# Patient Record
Sex: Male | Born: 1995
Health system: Southern US, Community
[De-identification: ages and names within clinical notes are randomized; demographics above are authoritative.]

---
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
## 2018-04-04 ENCOUNTER — Inpatient Hospital Stay (HOSPITAL_COMMUNITY): Payer: BLUE CROSS/BLUE SHIELD

## 2018-04-04 ENCOUNTER — Encounter (HOSPITAL_COMMUNITY): Admission: EM | Disposition: A | Discharge status: Home Health | Payer: Self-pay | Source: Home / Self Care

## 2018-04-04 ENCOUNTER — Other Ambulatory Visit: Payer: Self-pay

## 2018-04-04 ENCOUNTER — Encounter (HOSPITAL_COMMUNITY): Payer: Self-pay | Admitting: Emergency Medicine

## 2018-04-04 ENCOUNTER — Inpatient Hospital Stay (HOSPITAL_COMMUNITY)
Admission: EM | Admit: 2018-04-04 | Discharge: 2018-04-07 | DRG: 956 | Disposition: A | Discharge status: Home Health | Payer: BLUE CROSS/BLUE SHIELD | Attending: General Surgery | Admitting: General Surgery

## 2018-04-04 ENCOUNTER — Inpatient Hospital Stay (HOSPITAL_COMMUNITY): Payer: BLUE CROSS/BLUE SHIELD | Admitting: Anesthesiology

## 2018-04-04 ENCOUNTER — Emergency Department (HOSPITAL_COMMUNITY): Payer: BLUE CROSS/BLUE SHIELD

## 2018-04-04 DIAGNOSIS — S27329A Contusion of lung, unspecified, initial encounter: Secondary | ICD-10-CM

## 2018-04-04 DIAGNOSIS — Z87891 Personal history of nicotine dependence: Secondary | ICD-10-CM

## 2018-04-04 DIAGNOSIS — S72321A Displaced transverse fracture of shaft of right femur, initial encounter for closed fracture: Principal | ICD-10-CM

## 2018-04-04 DIAGNOSIS — R40241 Glasgow coma scale score 13-15, unspecified time: Secondary | ICD-10-CM | POA: Diagnosis present

## 2018-04-04 DIAGNOSIS — M79604 Pain in right leg: Secondary | ICD-10-CM | POA: Diagnosis present

## 2018-04-04 DIAGNOSIS — S27321A Contusion of lung, unilateral, initial encounter: Secondary | ICD-10-CM | POA: Diagnosis present

## 2018-04-04 DIAGNOSIS — S0001XA Abrasion of scalp, initial encounter: Secondary | ICD-10-CM | POA: Diagnosis present

## 2018-04-04 DIAGNOSIS — Z79899 Other long term (current) drug therapy: Secondary | ICD-10-CM | POA: Diagnosis not present

## 2018-04-04 DIAGNOSIS — Z9889 Other specified postprocedural states: Secondary | ICD-10-CM

## 2018-04-04 DIAGNOSIS — Y92410 Unspecified street and highway as the place of occurrence of the external cause: Secondary | ICD-10-CM

## 2018-04-04 DIAGNOSIS — Z419 Encounter for procedure for purposes other than remedying health state, unspecified: Secondary | ICD-10-CM

## 2018-04-04 DIAGNOSIS — R0781 Pleurodynia: Secondary | ICD-10-CM | POA: Diagnosis present

## 2018-04-04 DIAGNOSIS — S7291XA Unspecified fracture of right femur, initial encounter for closed fracture: Secondary | ICD-10-CM | POA: Diagnosis present

## 2018-04-04 DIAGNOSIS — S060X9A Concussion with loss of consciousness of unspecified duration, initial encounter: Secondary | ICD-10-CM | POA: Diagnosis present

## 2018-04-04 HISTORY — PX: FEMUR IM NAIL: SHX1597

## 2018-04-04 LAB — COMPREHENSIVE METABOLIC PANEL
ALT: 32 U/L (ref 0–44)
AST: 37 U/L (ref 15–41)
Albumin: 4.1 g/dL (ref 3.5–5.0)
Alkaline Phosphatase: 83 U/L (ref 38–126)
Anion gap: 13 (ref 5–15)
BUN: 14 mg/dL (ref 6–20)
CO2: 22 mmol/L (ref 22–32)
Calcium: 9.2 mg/dL (ref 8.9–10.3)
Chloride: 102 mmol/L (ref 98–111)
Creatinine, Ser: 0.89 mg/dL (ref 0.61–1.24)
GFR calc Af Amer: >60 mL/min (ref >60)
Glucose, Bld: 158 mg/dL — ABNORMAL HIGH (ref 70–99)
Potassium: 3.2 mmol/L — ABNORMAL LOW (ref 3.5–5.1)
Sodium: 137 mmol/L (ref 135–145)
TOTAL PROTEIN: 6.8 g/dL (ref 6.5–8.1)
Total Bilirubin: 0.7 mg/dL (ref 0.3–1.2)

## 2018-04-04 LAB — URINALYSIS, ROUTINE W REFLEX MICROSCOPIC
Bacteria, UA: NONE SEEN
Bilirubin Urine: NEGATIVE
Glucose, UA: 50 mg/dL — AB
Ketones, ur: NEGATIVE mg/dL
Leukocytes,Ua: NEGATIVE
Nitrite: NEGATIVE
Protein, ur: NEGATIVE mg/dL
Specific Gravity, Urine: 1.03 (ref 1.005–1.030)
pH: 6 (ref 5.0–8.0)

## 2018-04-04 LAB — MRSA PCR SCREENING: MRSA by PCR: NEGATIVE

## 2018-04-04 LAB — CDS SEROLOGY

## 2018-04-04 LAB — SAMPLE TO BLOOD BANK

## 2018-04-04 LAB — PROTIME-INR
INR: 0.9 (ref 0.8–1.2)
Prothrombin Time: 12.3 seconds (ref 11.4–15.2)

## 2018-04-04 LAB — CBC
HCT: 43 % (ref 39.0–52.0)
Hemoglobin: 14.4 g/dL (ref 13.0–17.0)
MCH: 30.3 pg (ref 26.0–34.0)
MCHC: 33.5 g/dL (ref 30.0–36.0)
MCV: 90.3 fL (ref 80.0–100.0)
Platelets: 398 10*3/uL (ref 150–400)
RBC: 4.76 MIL/uL (ref 4.22–5.81)
RDW: 11.8 % (ref 11.5–15.5)
WBC: 23.3 10*3/uL — ABNORMAL HIGH (ref 4.0–10.5)
nRBC: 0 % (ref 0.0–0.2)

## 2018-04-04 LAB — HIV ANTIBODY (ROUTINE TESTING W REFLEX): HIV Screen 4th Generation wRfx: NONREACTIVE

## 2018-04-04 LAB — ETHANOL: Alcohol, Ethyl (B): <10 mg/dL (ref <10)

## 2018-04-04 SURGERY — INSERTION, INTRAMEDULLARY ROD, FEMUR, RETROGRADE
Anesthesia: General | Site: Leg Upper | Laterality: Right

## 2018-04-04 MED ORDER — FENTANYL CITRATE (PF) 100 MCG/2ML IJ SOLN
INTRAMUSCULAR | Status: DC | PRN
  Administered 2018-04-04: 50 ug via INTRAVENOUS
  Administered 2018-04-04: 150 ug via INTRAVENOUS
  Administered 2018-04-04 (×2): 100 ug via INTRAVENOUS
  Administered 2018-04-04: 50 ug via INTRAVENOUS

## 2018-04-04 MED ORDER — OXYCODONE HCL 5 MG PO TABS
5.0000 mg | ORAL_TABLET | ORAL | Status: DC | PRN
  Administered 2018-04-04 – 2018-04-06 (×6): 5 mg via ORAL
  Filled 2018-04-04 (×6): qty 1

## 2018-04-04 MED ORDER — PROPOFOL 10 MG/ML IV BOLUS
INTRAVENOUS | Status: DC | PRN
  Administered 2018-04-04: 160 mg via INTRAVENOUS

## 2018-04-04 MED ORDER — ACETAMINOPHEN 500 MG PO TABS: 1000.0000 mg | ORAL_TABLET | Freq: Once | ORAL | Status: DC | PRN

## 2018-04-04 MED ORDER — ACETAMINOPHEN 160 MG/5ML PO SOLN: 1000.0000 mg | Freq: Once | ORAL | Status: DC | PRN

## 2018-04-04 MED ORDER — ENOXAPARIN SODIUM 40 MG/0.4ML ~~LOC~~ SOLN
40.0000 mg | SUBCUTANEOUS | Status: DC
  Administered 2018-04-05 – 2018-04-07 (×3): 40 mg via SUBCUTANEOUS
  Filled 2018-04-04 (×3): qty 0.4

## 2018-04-04 MED ORDER — METOPROLOL TARTRATE 5 MG/5ML IV SOLN: 5.0000 mg | Freq: Four times a day (QID) | INTRAVENOUS | Status: DC | PRN

## 2018-04-04 MED ORDER — LACTATED RINGERS IV SOLN
INTRAVENOUS | Status: DC | PRN
  Administered 2018-04-04 (×2): via INTRAVENOUS

## 2018-04-04 MED ORDER — ONDANSETRON HCL 4 MG/2ML IJ SOLN
INTRAMUSCULAR | Status: DC | PRN
  Administered 2018-04-04: 4 mg via INTRAVENOUS

## 2018-04-04 MED ORDER — ACETAMINOPHEN 325 MG PO TABS: 650.0000 mg | ORAL_TABLET | ORAL | Status: DC | PRN

## 2018-04-04 MED ORDER — HYDRALAZINE HCL 20 MG/ML IJ SOLN: 10.0000 mg | INTRAMUSCULAR | Status: DC | PRN

## 2018-04-04 MED ORDER — MIDAZOLAM HCL 2 MG/2ML IJ SOLN
INTRAMUSCULAR | Status: AC
  Filled 2018-04-04: qty 2

## 2018-04-04 MED ORDER — ONDANSETRON HCL 4 MG/2ML IJ SOLN
INTRAMUSCULAR | Status: AC
  Filled 2018-04-04: qty 2

## 2018-04-04 MED ORDER — SUGAMMADEX SODIUM 200 MG/2ML IV SOLN
INTRAVENOUS | Status: DC | PRN
  Administered 2018-04-04: 300 mg via INTRAVENOUS

## 2018-04-04 MED ORDER — DEXMEDETOMIDINE HCL IN NACL 200 MCG/50ML IV SOLN
INTRAVENOUS | Status: AC
  Filled 2018-04-04: qty 50

## 2018-04-04 MED ORDER — BUPIVACAINE HCL (PF) 0.25 % IJ SOLN
INTRAMUSCULAR | Status: AC
  Filled 2018-04-04: qty 30

## 2018-04-04 MED ORDER — OXYCODONE HCL 5 MG/5ML PO SOLN: 5.0000 mg | Freq: Once | ORAL | Status: DC | PRN

## 2018-04-04 MED ORDER — FENTANYL CITRATE (PF) 250 MCG/5ML IJ SOLN
INTRAMUSCULAR | Status: AC
  Filled 2018-04-04: qty 5

## 2018-04-04 MED ORDER — SUCCINYLCHOLINE CHLORIDE 20 MG/ML IJ SOLN
INTRAMUSCULAR | Status: DC | PRN
  Administered 2018-04-04: 100 mg via INTRAVENOUS

## 2018-04-04 MED ORDER — HYDROMORPHONE HCL 1 MG/ML IJ SOLN
1.0000 mg | Freq: Once | INTRAMUSCULAR | Status: AC
  Administered 2018-04-04: 1 mg via INTRAVENOUS
  Filled 2018-04-04: qty 1

## 2018-04-04 MED ORDER — SODIUM CHLORIDE 0.9 % IV BOLUS
1000.0000 mL | Freq: Once | INTRAVENOUS | Status: AC
  Administered 2018-04-04: 1000 mL via INTRAVENOUS

## 2018-04-04 MED ORDER — ROCURONIUM BROMIDE 100 MG/10ML IV SOLN
INTRAVENOUS | Status: DC | PRN
  Administered 2018-04-04: 30 mg via INTRAVENOUS

## 2018-04-04 MED ORDER — DEXMEDETOMIDINE HCL 200 MCG/2ML IV SOLN
INTRAVENOUS | Status: DC | PRN
  Administered 2018-04-04 (×3): 20 ug via INTRAVENOUS

## 2018-04-04 MED ORDER — IOHEXOL 300 MG/ML  SOLN
125.0000 mL | Freq: Once | INTRAMUSCULAR | Status: AC | PRN
  Administered 2018-04-04: 125 mL via INTRAVENOUS

## 2018-04-04 MED ORDER — 0.9 % SODIUM CHLORIDE (POUR BTL) OPTIME
TOPICAL | Status: DC | PRN
  Administered 2018-04-04 (×2): 1000 mL

## 2018-04-04 MED ORDER — PROPOFOL 10 MG/ML IV BOLUS
INTRAVENOUS | Status: AC
  Filled 2018-04-04: qty 20

## 2018-04-04 MED ORDER — CEFAZOLIN SODIUM-DEXTROSE 2-3 GM-%(50ML) IV SOLR
INTRAVENOUS | Status: DC | PRN
  Administered 2018-04-04: 2 g via INTRAVENOUS

## 2018-04-04 MED ORDER — DOCUSATE SODIUM 100 MG PO CAPS
100.0000 mg | ORAL_CAPSULE | Freq: Two times a day (BID) | ORAL | Status: DC
  Administered 2018-04-04 – 2018-04-07 (×6): 100 mg via ORAL
  Filled 2018-04-04 (×6): qty 1

## 2018-04-04 MED ORDER — METHOCARBAMOL 1000 MG/10ML IJ SOLN
500.0000 mg | Freq: Four times a day (QID) | INTRAVENOUS | Status: DC | PRN
  Administered 2018-04-04 – 2018-04-05 (×2): 500 mg via INTRAVENOUS
  Filled 2018-04-04 (×3): qty 5

## 2018-04-04 MED ORDER — OXYCODONE HCL 5 MG PO TABS: 5.0000 mg | ORAL_TABLET | Freq: Once | ORAL | Status: DC | PRN

## 2018-04-04 MED ORDER — CEFAZOLIN SODIUM-DEXTROSE 2-4 GM/100ML-% IV SOLN
2.0000 g | Freq: Three times a day (TID) | INTRAVENOUS | Status: AC
  Administered 2018-04-04 – 2018-04-05 (×3): 2 g via INTRAVENOUS
  Filled 2018-04-04 (×3): qty 100

## 2018-04-04 MED ORDER — SODIUM CHLORIDE 0.9 % IV SOLN
INTRAVENOUS | Status: DC
  Administered 2018-04-04 – 2018-04-05 (×2): via INTRAVENOUS

## 2018-04-04 MED ORDER — SUCCINYLCHOLINE CHLORIDE 200 MG/10ML IV SOSY
PREFILLED_SYRINGE | INTRAVENOUS | Status: AC
  Filled 2018-04-04: qty 10

## 2018-04-04 MED ORDER — MIDAZOLAM HCL 5 MG/5ML IJ SOLN
INTRAMUSCULAR | Status: DC | PRN
  Administered 2018-04-04: 2 mg via INTRAVENOUS

## 2018-04-04 MED ORDER — HYDROMORPHONE HCL 1 MG/ML IJ SOLN
0.5000 mg | INTRAMUSCULAR | Status: DC | PRN
  Administered 2018-04-04 – 2018-04-07 (×9): 0.5 mg via INTRAVENOUS
  Filled 2018-04-04 (×10): qty 1

## 2018-04-04 MED ORDER — ACETAMINOPHEN 10 MG/ML IV SOLN: 1000.0000 mg | Freq: Once | INTRAVENOUS | Status: DC | PRN

## 2018-04-04 MED ORDER — ROCURONIUM BROMIDE 50 MG/5ML IV SOSY
PREFILLED_SYRINGE | INTRAVENOUS | Status: AC
  Filled 2018-04-04: qty 5

## 2018-04-04 MED ORDER — FENTANYL CITRATE (PF) 100 MCG/2ML IJ SOLN
INTRAMUSCULAR | Status: AC
  Filled 2018-04-04: qty 2

## 2018-04-04 MED ORDER — SODIUM CHLORIDE 0.9 % IV SOLN
INTRAVENOUS | Status: DC
  Administered 2018-04-04: 06:00:00 via INTRAVENOUS

## 2018-04-04 MED ORDER — FENTANYL CITRATE (PF) 100 MCG/2ML IJ SOLN
25.0000 ug | INTRAMUSCULAR | Status: DC | PRN
  Administered 2018-04-04 (×2): 50 ug via INTRAVENOUS

## 2018-04-04 MED ORDER — LIDOCAINE HCL (CARDIAC) PF 100 MG/5ML IV SOSY
PREFILLED_SYRINGE | INTRAVENOUS | Status: DC | PRN
  Administered 2018-04-04: 80 mg via INTRAVENOUS

## 2018-04-04 MED ORDER — GABAPENTIN 300 MG PO CAPS
300.0000 mg | ORAL_CAPSULE | Freq: Three times a day (TID) | ORAL | Status: DC
  Administered 2018-04-04 – 2018-04-07 (×10): 300 mg via ORAL
  Filled 2018-04-04 (×10): qty 1

## 2018-04-04 MED ORDER — ONDANSETRON 4 MG PO TBDP: 4.0000 mg | ORAL_TABLET | Freq: Four times a day (QID) | ORAL | Status: DC | PRN

## 2018-04-04 MED ORDER — FENTANYL CITRATE (PF) 100 MCG/2ML IJ SOLN
INTRAMUSCULAR | Status: AC
  Administered 2018-04-04: 100 ug
  Filled 2018-04-04: qty 2

## 2018-04-04 MED ORDER — ONDANSETRON HCL 4 MG/2ML IJ SOLN: 4.0000 mg | Freq: Four times a day (QID) | INTRAMUSCULAR | Status: DC | PRN

## 2018-04-04 MED ORDER — LIDOCAINE 2% (20 MG/ML) 5 ML SYRINGE
INTRAMUSCULAR | Status: AC
  Filled 2018-04-04: qty 5

## 2018-04-04 SURGICAL SUPPLY — 68 items
ALCOHOL 70% 16 OZ (MISCELLANEOUS) ×3 IMPLANT
BANDAGE ACE 6X5 VEL STRL LF (GAUZE/BANDAGES/DRESSINGS) ×3 IMPLANT
BIT DRILL CALIBRATED 4.3MMX365 (DRILL) ×1 IMPLANT
BIT DRILL CROWE PNT TWST 4.5MM (DRILL) ×2 IMPLANT
BLADE CLIPPER SURG (BLADE) IMPLANT
BLADE SURG 15 STRL LF DISP TIS (BLADE) ×1 IMPLANT
BLADE SURG 15 STRL SS (BLADE) ×2
BNDG ELASTIC 6X15 VLCR STRL LF (GAUZE/BANDAGES/DRESSINGS) ×3 IMPLANT
BNDG GAUZE ELAST 4 BULKY (GAUZE/BANDAGES/DRESSINGS) ×3 IMPLANT
COVER SURGICAL LIGHT HANDLE (MISCELLANEOUS) ×3 IMPLANT
COVER WAND RF STERILE (DRAPES) ×3 IMPLANT
CUFF TOURNIQUET SINGLE 34IN LL (TOURNIQUET CUFF) IMPLANT
CUFF TOURNIQUET SINGLE 44IN (TOURNIQUET CUFF) IMPLANT
DRAPE C-ARM 42X72 X-RAY (DRAPES) ×3 IMPLANT
DRAPE HALF SHEET 40X57 (DRAPES) ×6 IMPLANT
DRAPE IMP U-DRAPE 54X76 (DRAPES) ×6 IMPLANT
DRAPE INCISE IOBAN 66X45 STRL (DRAPES) ×3 IMPLANT
DRAPE ORTHO SPLIT 77X108 STRL (DRAPES) ×4
DRAPE SURG ORHT 6 SPLT 77X108 (DRAPES) ×2 IMPLANT
DRILL CALIBRATED 4.3MMX365 (DRILL) ×3
DRILL CROWE POINT TWIST 4.5MM (DRILL) ×6
DRSG ADAPTIC 3X8 NADH LF (GAUZE/BANDAGES/DRESSINGS) ×3 IMPLANT
DRSG PAD ABDOMINAL 8X10 ST (GAUZE/BANDAGES/DRESSINGS) ×6 IMPLANT
DRSG TEGADERM 4X4.75 (GAUZE/BANDAGES/DRESSINGS) ×3 IMPLANT
DURAPREP 26ML APPLICATOR (WOUND CARE) ×3 IMPLANT
ELECT REM PT RETURN 9FT ADLT (ELECTROSURGICAL) ×3
ELECTRODE REM PT RTRN 9FT ADLT (ELECTROSURGICAL) ×1 IMPLANT
GAUZE SPONGE 4X4 12PLY STRL (GAUZE/BANDAGES/DRESSINGS) ×3 IMPLANT
GAUZE XEROFORM 1X8 LF (GAUZE/BANDAGES/DRESSINGS) ×3 IMPLANT
GLOVE BIOGEL PI IND STRL 7.0 (GLOVE) ×1 IMPLANT
GLOVE BIOGEL PI INDICATOR 7.0 (GLOVE) ×2
GLOVE ECLIPSE 7.0 STRL STRAW (GLOVE) ×3 IMPLANT
GLOVE SKINSENSE NS SZ7.5 (GLOVE) ×2
GLOVE SKINSENSE STRL SZ7.5 (GLOVE) ×1 IMPLANT
GLOVE SURG SYN 7.5  E (GLOVE) ×2
GLOVE SURG SYN 7.5 E (GLOVE) ×1 IMPLANT
GOWN SRG XL XLNG 56XLVL 4 (GOWN DISPOSABLE) ×1 IMPLANT
GOWN STRL NON-REIN XL XLG LVL4 (GOWN DISPOSABLE) ×2
GOWN STRL REUS W/ TWL LRG LVL3 (GOWN DISPOSABLE) ×2 IMPLANT
GOWN STRL REUS W/TWL LRG LVL3 (GOWN DISPOSABLE) ×4
GUIDEPIN 3.2X17.5 THRD DISP (PIN) ×3 IMPLANT
GUIDEWIRE BEAD TIP (WIRE) ×3 IMPLANT
KIT BASIN OR (CUSTOM PROCEDURE TRAY) ×3 IMPLANT
KIT TURNOVER KIT B (KITS) ×3 IMPLANT
MANIFOLD NEPTUNE II (INSTRUMENTS) ×3 IMPLANT
NAIL FEM RETRO 10.5X380 (Nail) ×3 IMPLANT
NEEDLE 22X1 1/2 (OR ONLY) (NEEDLE) ×3 IMPLANT
NS IRRIG 1000ML POUR BTL (IV SOLUTION) ×3 IMPLANT
PACK GENERAL/GYN (CUSTOM PROCEDURE TRAY) ×3 IMPLANT
PACK UNIVERSAL I (CUSTOM PROCEDURE TRAY) ×3 IMPLANT
PAD ARMBOARD 7.5X6 YLW CONV (MISCELLANEOUS) ×6 IMPLANT
PADDING CAST COTTON 6X4 STRL (CAST SUPPLIES) ×3 IMPLANT
SCREW CORT TI DBL LEAD 5X40 (Screw) ×3 IMPLANT
SCREW CORT TI DBL LEAD 5X75 (Screw) ×3 IMPLANT
STAPLER VISISTAT 35W (STAPLE) IMPLANT
STOCKINETTE 6  STRL (DRAPES) ×2
STOCKINETTE 6 STRL (DRAPES) ×1 IMPLANT
STOCKINETTE IMPERVIOUS LG (DRAPES) ×3 IMPLANT
SUT VIC AB 0 CT1 27 (SUTURE) ×2
SUT VIC AB 0 CT1 27XBRD ANBCTR (SUTURE) ×1 IMPLANT
SUT VIC AB 0 CTB1 27 (SUTURE) IMPLANT
SUT VIC AB 2-0 CT1 27 (SUTURE) ×4
SUT VIC AB 2-0 CT1 TAPERPNT 27 (SUTURE) ×2 IMPLANT
SUT VIC AB 2-0 CTB1 (SUTURE) IMPLANT
SYR 20ML ECCENTRIC (SYRINGE) ×3 IMPLANT
TOWEL OR 17X24 6PK STRL BLUE (TOWEL DISPOSABLE) ×3 IMPLANT
TOWEL OR 17X26 10 PK STRL BLUE (TOWEL DISPOSABLE) ×3 IMPLANT
WATER STERILE IRR 1000ML POUR (IV SOLUTION) ×3 IMPLANT

## 2018-04-04 NOTE — ED Notes (Addendum)
Darryl Nguyen - Trauma MD at bedside evaluating pt.
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
## 2018-04-04 NOTE — Anesthesia Preprocedure Evaluation (Addendum)
Anesthesia Evaluation  Patient identified by MRN, date of birth, ID band Patient awake    Reviewed: Allergy & Precautions, NPO status , Patient's Chart, lab work & pertinent test results  History of Anesthesia Complications Negative for: history of anesthetic complications  Airway Mallampati: III  TM Distance: >3 FB Neck ROM: Full    Dental  (+) Teeth Intact   Pulmonary neg pulmonary ROS,    breath sounds clear to auscultation       Cardiovascular negative cardio ROS   Rhythm:Regular Rate:Tachycardia     Neuro/Psych negative neurological ROS  negative psych ROS   GI/Hepatic negative GI ROS, Neg liver ROS,   Endo/Other  negative endocrine ROS  Renal/GU negative Renal ROS     Musculoskeletal Right femoral fx   Abdominal   Peds  Hematology negative hematology ROS (+)   Anesthesia Other Findings   Reproductive/Obstetrics                             Anesthesia Physical Anesthesia Plan  ASA: I  Anesthesia Plan: General   Post-op Pain Management:    Induction: Intravenous, Cricoid pressure planned and Rapid sequence  PONV Risk Score and Plan: 2 and Ondansetron and Dexamethasone  Airway Management Planned: Oral ETT  Additional Equipment: None  Intra-op Plan:   Post-operative Plan: Extubation in OR  Informed Consent: I have reviewed the patients History and Physical, chart, labs and discussed the procedure including the risks, benefits and alternatives for the proposed anesthesia with the patient or authorized representative who has indicated his/her understanding and acceptance.     Dental advisory given  Plan Discussed with: CRNA and Surgeon  Anesthesia Plan Comments:         Anesthesia Quick Evaluation

## 2018-04-04 NOTE — Progress Notes (Signed)
I have posted patient for right femur fixation at 8 am pending trauma clearance.  Will see patient this morning for full consult.
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
## 2018-04-04 NOTE — Anesthesia Postprocedure Evaluation (Signed)
Anesthesia Post Note  Patient: Darryl Nguyen  Procedure(s) Performed: INTRAMEDULLARY (IM) RETROGRADE FEMORAL NAILING (Right Leg Upper)     Patient location during evaluation: PACU Anesthesia Type: General Level of consciousness: awake and alert Pain management: pain level controlled Vital Signs Assessment: post-procedure vital signs reviewed and stable Respiratory status: spontaneous breathing, nonlabored ventilation, respiratory function stable and patient connected to nasal cannula oxygen Cardiovascular status: blood pressure returned to baseline and stable Postop Assessment: no apparent nausea or vomiting Anesthetic complications: no    Last Vitals:  Vitals:   04/04/18 1311 04/04/18 1654  BP: 136/82 (!) 144/81  Pulse: 94 (!) 110  Resp: 19 18  Temp: 36.9 C 36.7 C  SpO2: 97% 97%    Last Pain:  Vitals:   04/04/18 1657  TempSrc:   PainSc: 8                  Darryl Nguyen

## 2018-04-04 NOTE — ED Notes (Signed)
Patient placed on a monitor and pulse oximetry , IV sites intact , plan of care explained by EDP/nurse . Respirations unlabored , Hare traction intact .
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
## 2018-04-04 NOTE — Transfer of Care (Signed)
Immediate Anesthesia Transfer of Care Note  Patient: Darryl Nguyen  Procedure(s) Performed: INTRAMEDULLARY (IM) RETROGRADE FEMORAL NAILING (Right Leg Upper)  Patient Location: PACU  Anesthesia Type:General  Level of Consciousness: drowsy  Airway & Oxygen Therapy: Patient Spontanous Breathing and Patient connected to nasal cannula oxygen  Post-op Assessment: Report given to RN, Post -op Vital signs reviewed and stable and Patient moving all extremities  Post vital signs: Reviewed and stable  Last Vitals:  Vitals Value Taken Time  BP 146/82 04/04/2018 11:55 AM  Temp    Pulse 104 04/04/2018 12:00 PM  Resp 17 04/04/2018 12:00 PM  SpO2 100 % 04/04/2018 12:00 PM  Vitals shown include unvalidated device data.  Last Pain:  Vitals:   04/04/18 0643  TempSrc:   PainSc: 0-No pain         Complications: No apparent anesthesia complications
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
## 2018-04-04 NOTE — Anesthesia Procedure Notes (Signed)
Procedure Name: Intubation Date/Time: 04/04/2018 9:46 AM Performed by: Colleene Swarthout T, CRNA Pre-anesthesia Checklist: Patient identified, Emergency Drugs available, Suction available and Patient being monitored Patient Re-evaluated:Patient Re-evaluated prior to induction Oxygen Delivery Method: Circle system utilized Preoxygenation: Pre-oxygenation with 100% oxygen Induction Type: IV induction, Rapid sequence and Cricoid Pressure applied Laryngoscope Size: Miller and 3 Grade View: Grade II Tube type: Oral Tube size: 7.5 mm Number of attempts: 1 Airway Equipment and Method: Patient positioned with wedge pillow and Stylet Placement Confirmation: ETT inserted through vocal cords under direct vision,  positive ETCO2 and breath sounds checked- equal and bilateral Secured at: 22 cm Tube secured with: Tape Dental Injury: Teeth and Oropharynx as per pre-operative assessment

## 2018-04-04 NOTE — ED Notes (Signed)
Patient currently at CT scan .  

## 2018-04-04 NOTE — ED Notes (Signed)
Patient's clothes/personal belongings given to spouse to take home .

## 2018-04-04 NOTE — Op Note (Signed)
   Date of Surgery: 04/04/2018  INDICATIONS: Mr. Thagard is a 23 y.o.-year-old male with a right femur fracture;  The patient did consent to the procedure after discussion of the risks and benefits.  PREOPERATIVE DIAGNOSIS: Right femoral shaft fracture  POSTOPERATIVE DIAGNOSIS: Same.  PROCEDURE: Retrograde intramedullary fixation of right femur fracture  SURGEON: N. Glee Arvin, M.D.  ASSIST: Starlyn Skeans [place], [place]; necessary for the timely completion of procedure and due to complexity of procedure.  ANESTHESIA:  general  IV FLUIDS AND URINE: See anesthesia.  ESTIMATED BLOOD LOSS: 150 mL.  IMPLANTS: Biomet 10 x 380   DRAINS: none  COMPLICATIONS: None.  DESCRIPTION OF PROCEDURE: The patient was brought to the operating room and placed supine on the operating table.  The patient had been signed prior to the procedure and this was documented. The patient had the anesthesia placed by the anesthesiologist.  A time-out was performed to confirm that this was the correct patient, site, side and location. The patient did receive antibiotics prior to the incision and was re-dosed during the procedure as needed at indicated intervals.  The patient had the operative extremity prepped and draped in the standard surgical fashion.    An incision was made directly over the patellar tendon and a patellar tendon splitting approach was used to gain entry into the knee joint.  With the knee semi-flexed a guidepin was placed at the starting site using fluoroscopic guidance and then advanced into the femoral canal.  The tissue protector was then inserted over the wire and opening reamer was used to gain entry into the medullary canal.  This was then removed and a finger reducer was then advanced up the medullary canal across the fracture site while it was reduced and confirmed under fluoroscopy.  A guidewire was then advanced through the finger reducer up to the proximal femur at the appropriate depth.  The  reducer was then removed and the length of the nail was measured to be 380 mm.  Sequential reaming was then performed until there was adequate chatter at 12 mm.  A 10 x 380 mm retrograde femoral nail was then inserted over the guidewire to the appropriate depth using fluoroscopic guidance.  The fracture was then compressed and visualized on serial x-rays.  One distal interlocking screw was then placed through the jig from lateral to medial with excellent purchase and a single proximal interlocking screw was placed using the perfect circle technique.  The screw had excellent purchase as well.  The fracture site had good apposition of the cortices.  Final x-rays were taken.  The surgical wounds were then thoroughly irrigated with normal saline and closed in a layered fashion using 0 Vicryl for the peritenon, 2-0 Vicryl for the subcutaneous layer and staples for the skin.  Sterile dressings were applied.  Patient tolerated procedure well had no immediate complications.  POSTOPERATIVE PLAN: Patient will be weight-bear as tolerated to the right lower extremity.  He is to mobilize with physical therapy.  Mayra Reel, MD [facility] [phone] 11:31 AM
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
## 2018-04-04 NOTE — Progress Notes (Signed)
Notified patient that he was listed as a confidential patient. Patient advised that he wanted his family to be able to locate him. I spoke to ED registration and requested that same be removed.

## 2018-04-05 ENCOUNTER — Inpatient Hospital Stay (HOSPITAL_COMMUNITY): Payer: BLUE CROSS/BLUE SHIELD

## 2018-04-05 LAB — CBC
HCT: 33.5 % — ABNORMAL LOW (ref 39.0–52.0)
Hemoglobin: 11.6 g/dL — ABNORMAL LOW (ref 13.0–17.0)
MCH: 30.5 pg (ref 26.0–34.0)
MCHC: 34.6 g/dL (ref 30.0–36.0)
MCV: 88.2 fL (ref 80.0–100.0)
PLATELETS: 302 10*3/uL (ref 150–400)
RBC: 3.8 MIL/uL — ABNORMAL LOW (ref 4.22–5.81)
RDW: 12 % (ref 11.5–15.5)
WBC: 14.7 10*3/uL — ABNORMAL HIGH (ref 4.0–10.5)
nRBC: 0 % (ref 0.0–0.2)

## 2018-04-05 LAB — LACTIC ACID, PLASMA: Lactic Acid, Venous: 3.4 mmol/L (ref 0.5–1.9)

## 2018-04-05 LAB — BASIC METABOLIC PANEL
Anion gap: 10 (ref 5–15)
BUN: 6 mg/dL (ref 6–20)
CO2: 23 mmol/L (ref 22–32)
Calcium: 9.1 mg/dL (ref 8.9–10.3)
Chloride: 104 mmol/L (ref 98–111)
Creatinine, Ser: 0.81 mg/dL (ref 0.61–1.24)
GFR calc Af Amer: >60 mL/min (ref >60)
Glucose, Bld: 140 mg/dL — ABNORMAL HIGH (ref 70–99)
Potassium: 3.8 mmol/L (ref 3.5–5.1)
Sodium: 137 mmol/L (ref 135–145)

## 2018-04-05 MED ORDER — ACETAMINOPHEN 325 MG PO TABS
650.0000 mg | ORAL_TABLET | Freq: Four times a day (QID) | ORAL | Status: DC
  Administered 2018-04-05 – 2018-04-07 (×8): 650 mg via ORAL
  Filled 2018-04-05 (×9): qty 2

## 2018-04-05 MED ORDER — METHOCARBAMOL 500 MG PO TABS
500.0000 mg | ORAL_TABLET | Freq: Four times a day (QID) | ORAL | Status: DC | PRN
  Administered 2018-04-05 – 2018-04-07 (×7): 500 mg via ORAL
  Filled 2018-04-05 (×7): qty 1
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
## 2018-04-05 NOTE — Progress Notes (Signed)
Rehab Admissions Coordinator Note:  Patient was screened by Clois Dupes for appropriateness for an Inpatient Acute Rehab Consult per PT recs. .  At this time, we are recommending Inpatient Rehab consult.  Clois Dupes RN MSN 04/05/2018, 2:05 PM  I can be reached at [phone].
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
## 2018-04-05 NOTE — Progress Notes (Signed)
Patient is stable this morning and had no events. Complaining of marked pain with weight bearing. Compartments soft, surgical dressings c/d/i. Up with PT/OT for mobilization Lovenox DVT ppx daily Follow up in office in 2 weeks for wound check and staple removal  N. Glee Arvin, MD [facility] [phone] 7:11 AM

## 2018-04-06 ENCOUNTER — Encounter (HOSPITAL_COMMUNITY): Payer: Self-pay | Admitting: Orthopaedic Surgery

## 2018-04-06 MED ORDER — OXYCODONE HCL 5 MG PO TABS
5.0000 mg | ORAL_TABLET | ORAL | Status: DC | PRN
  Administered 2018-04-06 (×2): 10 mg via ORAL
  Administered 2018-04-06: 5 mg via ORAL
  Administered 2018-04-06 – 2018-04-07 (×4): 10 mg via ORAL
  Filled 2018-04-06 (×6): qty 2

## 2018-04-06 NOTE — Progress Notes (Signed)
Patient c/o pain early this morning, oxy increased from 5-10 mg, trying to stop IV dilaudid since patient has been screened for CIR.  Nurse observed patient with therapies this morning and he really could use CIR, however patient wants to be at his baby shower on Saturday and is apprehensive about going to rehab.  Nurse spoke with admissions coordinator and will soft educate patient on the importance of proper rehabilitation for long term goals.  Education given to patient (mom was on the phone). Mom states she will come up later in the evening to further discuss options with patient and patient's wife.

## 2018-04-06 NOTE — Progress Notes (Signed)
CSW completed SBIRT with the patient. Please see flowsheets for more information.   No further interventions are required at this time. CSW signing off.   Drucilla Schmidt, MSW, LCSW-A Clinical Social Worker [employer]
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
## 2018-04-06 NOTE — Progress Notes (Signed)
Inpatient Rehabilitation-Admissions Coordinator    Met with patient and wife at the bedside to discuss team's recommendation for inpatient rehabilitation. Shared booklets, expectations while in CIR, expected length of stay, and anticipated functional level at DC. Pt hesitant about CIR as he does not want to miss his wife's baby shower this Saturday. Pt would like to see how he does with therapies this morning prior to making decision on CIR as ultimately he would like to go home if he is able.   AC will follow up after therapies.   Jhonnie Garner, OTR/L  Rehab Admissions Coordinator  [phone] 04/06/2018 9:51 AM
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
## 2018-04-07 MED ORDER — ACETAMINOPHEN 325 MG PO TABS: 650.0000 mg | ORAL_TABLET | Freq: Four times a day (QID) | ORAL | Status: AC

## 2018-04-07 MED ORDER — ENOXAPARIN SODIUM 40 MG/0.4ML ~~LOC~~ SOLN: 40.0000 mg | SUBCUTANEOUS | 0 refills | Status: AC

## 2018-04-07 MED ORDER — OXYCODONE HCL 5 MG PO TABS: 5.0000 mg | ORAL_TABLET | Freq: Four times a day (QID) | ORAL | 0 refills | Status: AC | PRN

## 2018-04-07 MED ORDER — METHOCARBAMOL 500 MG PO TABS: 500.0000 mg | ORAL_TABLET | Freq: Four times a day (QID) | ORAL | 0 refills | Status: DC | PRN

## 2018-04-07 MED ORDER — GABAPENTIN 300 MG PO CAPS: 300.0000 mg | ORAL_CAPSULE | Freq: Three times a day (TID) | ORAL | 0 refills | Status: AC

## 2018-04-07 MED ORDER — DOCUSATE SODIUM 100 MG PO CAPS: 100.0000 mg | ORAL_CAPSULE | Freq: Two times a day (BID) | ORAL | 0 refills | Status: AC

## 2018-04-07 MED FILL — ENOXAPARIN 40 MG/0.4 ML SYR: 40 | 14 days supply | Qty: 6 | Fill #0

## 2018-04-07 MED FILL — GABAPENTIN 300 MG CAPSULE: 300 | 13 days supply | Qty: 40 | Fill #0

## 2018-04-07 MED FILL — oxyCODONE HCL 5 MG TABS: 5 | 7 days supply | Qty: 30 | Fill #0

## 2018-04-07 MED FILL — METHOCARBAMOL 500 MG TABLET: 500 | 8 days supply | Qty: 30 | Fill #0
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
## 2018-04-07 NOTE — Discharge Instructions (Addendum)
Enoxaparin injection What is this medicine? ENOXAPARIN (ee nox a PA rin) is used after knee, hip, or abdominal surgeries to prevent blood clotting. It is also used to treat existing blood clots in the lungs or in the veins. This medicine may be used for other purposes; ask your health care provider or pharmacist if you have questions. COMMON BRAND NAME(S): Lovenox What should I tell my health care provider before I take this medicine? They need to know if you have any of these conditions: -bleeding disorders, hemorrhage, or hemophilia -infection of the heart or heart valves -kidney or liver disease -previous stroke -prosthetic heart valve -recent surgery or delivery of a baby -ulcer in the stomach or intestine, diverticulitis, or other bowel disease -an unusual or allergic reaction to enoxaparin, heparin, pork or pork products, other medicines, foods, dyes, or preservatives -pregnant or trying to get pregnant -breast-feeding How should I use this medicine? This medicine is for injection under the skin. It is usually given by a health-care professional. You or a family member may be trained on how to give the injections. If you are to give yourself injections, make sure you understand how to use the syringe, measure the dose if necessary, and give the injection. To avoid bruising, do not rub the site where this medicine has been injected. Do not take your medicine more often than directed. Do not stop taking except on the advice of your doctor or health care professional. Make sure you receive a puncture-resistant container to dispose of the needles and syringes once you have finished with them. Do not reuse these items. Return the container to your doctor or health care professional for proper disposal. Talk to your pediatrician regarding the use of this medicine in children. Special care may be needed. Overdosage: If you think you have taken too much of this medicine contact a poison control  center or emergency room at once. NOTE: This medicine is only for you. Do not share this medicine with others. What if I miss a dose? If you miss a dose, take it as soon as you can. If it is almost time for your next dose, take only that dose. Do not take double or extra doses. What may interact with this medicine? -aspirin and aspirin-like medicines -certain medicines that treat or prevent blood clots -dipyridamole -NSAIDs, medicines for pain and inflammation, like ibuprofen or naproxen This list may not describe all possible interactions. Give your health care provider a list of all the medicines, herbs, non-prescription drugs, or dietary supplements you use. Also tell them if you smoke, drink alcohol, or use illegal drugs. Some items may interact with your medicine. What should I watch for while using this medicine? Visit your healthcare professional for regular checks on your progress. You may need blood work done while you are taking this medicine. Your condition will be monitored carefully while you are receiving this medicine. It is important not to miss any appointments. If you are going to need surgery or other procedure, tell your healthcare professional that you are using this medicine. Using this medicine for a long time may weaken your bones and increase the risk of bone fractures. Avoid sports and activities that might cause injury while you are using this medicine. Severe falls or injuries can cause unseen bleeding. Be careful when using sharp tools or knives. Consider using an Copy. Take special care brushing or flossing your teeth. Report any injuries, bruising, or red spots on the skin to your healthcare  professional. Wear a medical ID bracelet or chain. Carry a card that describes your disease and details of your medicine and dosage times. What side effects may I notice from receiving this medicine? Side effects that you should report to your doctor or health care  professional as soon as possible: -allergic reactions like skin rash, itching or hives, swelling of the face, lips, or tongue -bone pain -signs and symptoms of bleeding such as bloody or black, tarry stools; red or dark-brown urine; spitting up blood or brown material that looks like coffee grounds; red spots on the skin; unusual bruising or bleeding from the eye, gums, or nose -signs and symptoms of a blood clot such as chest pain; shortness of breath; pain, swelling, or warmth in the leg -signs and symptoms of a stroke such as changes in vision; confusion; trouble speaking or understanding; severe headaches; sudden numbness or weakness of the face, arm or leg; trouble walking; dizziness; loss of coordination Side effects that usually do not require medical attention (report to your doctor or health care professional if they continue or are bothersome): -hair loss -pain, redness, or irritation at site where injected This list may not describe all possible side effects. Call your doctor for medical advice about side effects. You may report side effects to FDA at 1-800-FDA-1088. Where should I keep my medicine? Keep out of the reach of children. Store at room temperature between 15 and 30 degrees C (59 and 86 degrees F). Do not freeze. If your injections have been specially prepared, you may need to store them in the refrigerator. Ask your pharmacist. Throw away any unused medicine after the expiration date. NOTE: This sheet is a summary. It may not cover all possible information. If you have questions about this medicine, talk to your doctor, pharmacist, or health care provider.  2019 Elsevier/Gold Standard (2017-01-09 11:25:34)    1. Change dressings as needed 2. May shower but keep incisions covered and dry 3. Take lovenox to prevent blood clots 4. Take stool softeners as needed 5. Take pain meds as needed   Pain Medicine Instructions You may need pain medicine after an injury or  illness. Two common types of pain medicine are:  Opioid pain medicine. These may be called opioids.  Non-opioid pain medicine. This includes NSAIDs. It is important to follow your doctor's instructions when you are taking pain medicine. Doing this can keep yourself and others safe. How can pain medicine affect me? Pain medicine may not make all of your pain go away. It should make you comfortable enough to:  Move.  Breathe.  Do normal activities. Opioids can cause side effects, such as:  Trouble pooping (constipation).  Feeling sick to your stomach (nausea).  Throwing up (vomiting).  Feeling very sleepy.  Confusion.  Taking the medicine for nonmedical reasons even though taking it hurts your health and well-being (opioid use disorder).  Trouble breathing (respiratory depression). Taking opioids for longer than 3 days raises your risk of these side effects. Taking opioids for a long time can affect how well you can do daily tasks. Taking them for a long time also puts you at risk for:  Car crashes.  Depression.  Suicide.  Heart attack.  Taking too much of the medicine (overdose). This can lead to death. What should I do to stay safe while taking pain medicine? Take your medicine as told  Take pain medicine exactly as told by your doctor. Take it only when you need it.  Write down  the times when you take your pain medicine. Look at the times before you take your next dose.  Take other over-the-counter or prescription medicines only as told by your doctor. ? If your pain medicine has acetaminophen in it, do not take any other acetaminophen while you are taking this medicine. Too much can damage the liver.  Get pain medicine prescriptions from only one doctor. Avoid certain activities While you are taking prescription pain medicine, and for 8 hours after your last dose:  Do not drive.  Do not use machinery.  Do not use power tools.  Do not sign legal  documents.  Do not drink alcohol.  Do not take sleeping pills.  Do not take care of children by yourself.  Do not do any activities that involve climbing or being in high places.  Do not go into any body of water unless there is an adult nearby who can watch you and help you if needed. This includes: ? Lakes. ? Rivers. ? Oceans. ? Spas. ? Swimming pools.  Keep others safe  Store your medicine as told by your doctor. Keep it where children and pets cannot reach it.  Do not share your pain medicine with anyone.  Do not save any leftover pills. If you have leftover pills, you can: ? Bring them to a take-back program. ? Bring them to a pharmacy that has a drug disposal container. ? Throw them in the trash. Check the medicine label or package insert to see if it is safe to throw it out. If it is safe, take the medicine out of the container. Mix it with something that makes it unusable, such as pet waste. Then put the medicine in the trash. General instructions  Talk with your doctor about other ways to manage your pain.  If you have trouble pooping: ? Drink enough fluid to keep your pee (urine) pale yellow. ? Use a poop (stool) softener as told by your doctor. ? Eat more fruits and vegetables.  Keep all follow-up visits as told by your doctor. This is important. Contact a doctor if:  Your medicine is not helping with your pain.  You have a rash.  You feel depressed. Get help right away if: Seek medical care right away if you are taking pain medicines and you (or people close to you) notice any of the following:  Trouble breathing.  Breathing that is shorter than normal.  Breathing that is more shallow than normal.  Confusion.  Sleepiness.  Trouble staying awake.  Feeling sick to your stomach.  Throwing up.  Your skin or lips turning pale or bluish in color.  Tongue swelling. If you ever feel like you may hurt yourself or others, or have thoughts about  taking your own life, get help right away. Go to your nearest emergency department or call:  Your local emergency services (911 in the [place]  A suicide crisis helpline, such as the National Suicide Prevention Lifeline at 56149021371-646-012-0248. This is open 24 hours a day. Summary  Take your pain medicine exactly as told by your doctor.  Pain medicine can help lower your pain. It may also cause side effects.  Talk with your doctor about other ways to manage your pain.  Follow your doctor's instructions about how to take your pain medicine and keep others safe. Ask what activities you should avoid while taking pain medicine. This information is not intended to replace advice given to you by your health care provider. Make sure you  discuss any questions you have with your health care provider. Document Released: 07/03/2007 Document Revised: 08/26/2016 Document Reviewed: 08/26/2016 Elsevier Interactive Patient Education  2019 [employer].

## 2018-04-07 NOTE — Progress Notes (Signed)
3 in 1 delivered, patient set to discharge, education complete.
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
## 2018-04-07 NOTE — Progress Notes (Signed)
Patient again refusing CIR, educated on the benefits of going vs the risk of not going, patient wiling to deal with the risk. Made a comment that "therapy pushed me too hard yesterday and I am not doing that".  Mom at bedside and also educated.  Patient has 3 steps to get into home.  PT stated he went off unit and trial with steps.  Case management states patient is cleared from her standpoint. Waiting on discharge orders and equipment.

## 2018-04-14 ENCOUNTER — Telehealth (INDEPENDENT_AMBULATORY_CARE_PROVIDER_SITE_OTHER): Payer: Self-pay | Admitting: Orthopaedic Surgery

## 2018-04-14 NOTE — Telephone Encounter (Signed)
Yes #30 of each.  Oxy 1-2 tab bid prn pain

## 2018-04-14 NOTE — Telephone Encounter (Signed)
Patient's wife called requesting an RX refill on both the Robaxin and the Oxycodone.  Patient will not have enough to last until the f/u visit with Dr. Roda Shutters on Friday.  CB#[phone].  Please call when RX is sent in.  Thank you.

## 2018-04-14 NOTE — Telephone Encounter (Signed)
SEE MESSAGE BELOW.    Can you Refill both Rx into patients pharm.

## 2018-04-15 ENCOUNTER — Other Ambulatory Visit (INDEPENDENT_AMBULATORY_CARE_PROVIDER_SITE_OTHER): Payer: Self-pay

## 2018-04-15 MED ORDER — OXYCODONE HCL 5 MG PO TABS: 5.0000 mg | ORAL_TABLET | Freq: Two times a day (BID) | ORAL | 0 refills | Status: AC | PRN

## 2018-04-15 MED ORDER — METHOCARBAMOL 500 MG PO TABS: 500.0000 mg | ORAL_TABLET | Freq: Four times a day (QID) | ORAL | 0 refills | Status: DC | PRN

## 2018-04-15 NOTE — Telephone Encounter (Signed)
Faxed Robaxin into his pharm.   He would like Oxycodone also sent to  Pharm [facility].

## 2018-04-17 ENCOUNTER — Telehealth (INDEPENDENT_AMBULATORY_CARE_PROVIDER_SITE_OTHER): Payer: Self-pay | Admitting: Orthopaedic Surgery

## 2018-04-17 ENCOUNTER — Other Ambulatory Visit: Payer: Self-pay

## 2018-04-17 ENCOUNTER — Encounter (INDEPENDENT_AMBULATORY_CARE_PROVIDER_SITE_OTHER): Payer: Self-pay | Admitting: Physician Assistant

## 2018-04-17 ENCOUNTER — Ambulatory Visit (INDEPENDENT_AMBULATORY_CARE_PROVIDER_SITE_OTHER): Payer: BLUE CROSS/BLUE SHIELD | Admitting: Physician Assistant

## 2018-04-17 ENCOUNTER — Ambulatory Visit (INDEPENDENT_AMBULATORY_CARE_PROVIDER_SITE_OTHER): Payer: BLUE CROSS/BLUE SHIELD

## 2018-04-17 DIAGNOSIS — S72321D Displaced transverse fracture of shaft of right femur, subsequent encounter for closed fracture with routine healing: Secondary | ICD-10-CM | POA: Diagnosis not present

## 2018-04-17 NOTE — Progress Notes (Signed)
   Post-Op Visit Note   Patient: Darryl Nguyen           Date of Birth: 1995-03-22           MRN: [id number] Visit Date: 04/17/2018 PCP: Patient, No Pcp Per   Assessment & Plan:  Chief Complaint:  Chief Complaint  Patient presents with  . Right Leg - Routine Post Op   Visit Diagnoses:  1. Closed displaced transverse fracture of shaft of right femur with routine healing, subsequent encounter     Plan: Patient is a pleasant 23 year old gentleman who presents our clinic today 13 days status post retrograde femoral nail on the right, date of surgery 04/04/2018.  He has been doing fairly well.  Minimal pain.  He has been at home getting home health physical therapy.  No fevers or chills.  Examination of his right leg reveals well healing surgical incisions with staples intact.  He has some moderate swelling throughout.  Calf is soft nontender.  At this point, he will continue with home health physical therapy.  He will continue to work on range of motion exercises on his own as well.  He is weightbearing as tolerated.  Follow-up with [place] in 4 weeks time for repeat evaluation and x-rays.  Follow-Up Instructions: Return in about 4 weeks (around 05/15/2018).   Orders:  Orders Placed This Encounter  Procedures  . XR FEMUR, MIN 2 VIEWS RIGHT   No orders of the defined types were placed in this encounter.   Imaging: Xr Femur, Min 2 Views Right  Result Date: 04/17/2018 X-rays demonstrate stable fixation of the fracture without complication   PMFS History: Patient Active Problem List   Diagnosis Date Noted  . Closed displaced transverse fracture of shaft of right femur (HCC) 04/04/2018   History reviewed. No pertinent past medical history.  History reviewed. No pertinent family history.  Past Surgical History:  Procedure Laterality Date  . FEMUR IM NAIL Right 04/04/2018   Procedure: INTRAMEDULLARY (IM) RETROGRADE FEMORAL NAILING;  Surgeon: Tarry Kos, MD;  Location: [facility] OR;  Service:  Orthopedics;  Laterality: Right;   Social History   Occupational History  . Not on file  Tobacco Use  . Smoking status: Never Smoker  . Smokeless tobacco: Never Used  Substance and Sexual Activity  . Alcohol use: Never    Frequency: Never  . Drug use: Never  . Sexual activity: Not on file

## 2018-04-17 NOTE — Telephone Encounter (Signed)
Maralyn Sago called for verbal orders for pt  1x a week for 1 week and  2 x a week for 4 weeks

## 2018-04-20 NOTE — Telephone Encounter (Signed)
Called Maralyn Sago back to approve orders.

## 2018-04-22 ENCOUNTER — Telehealth (INDEPENDENT_AMBULATORY_CARE_PROVIDER_SITE_OTHER): Payer: Self-pay

## 2018-04-22 ENCOUNTER — Other Ambulatory Visit (INDEPENDENT_AMBULATORY_CARE_PROVIDER_SITE_OTHER): Payer: Self-pay

## 2018-04-22 MED ORDER — OXYCODONE HCL 5 MG PO TABS: 5.0000 mg | ORAL_TABLET | Freq: Every day | ORAL | 0 refills | Status: AC | PRN

## 2018-04-22 MED ORDER — METHOCARBAMOL 500 MG PO TABS: 500.0000 mg | ORAL_TABLET | Freq: Four times a day (QID) | ORAL | 0 refills | Status: DC | PRN

## 2018-04-22 NOTE — Telephone Encounter (Signed)
Patient states that short term disability paperwork that was filled out recently has a part that was filled out incorrectly and he would like to speak with someone about this. Page 3, part B, question 5 is not correct. Needs this corrected by Friday. Please call to discuss.

## 2018-04-22 NOTE — Telephone Encounter (Signed)
See message below °

## 2018-04-22 NOTE — Addendum Note (Signed)
Addended by: Mayra Reel on: 04/22/2018 02:50 PM   Modules accepted: Orders

## 2018-04-22 NOTE — Telephone Encounter (Signed)
His appt is scheduled for 05/12/2018.  Sent Rx for Robaxin into pharm. Please send in Oxycodone to his pharm. Thanks.

## 2018-04-22 NOTE — Telephone Encounter (Signed)
When is he rescheduling his appt for?  #30 of each.

## 2018-04-22 NOTE — Telephone Encounter (Signed)
Called pt to reschedule upcoming appt. He states he needs a refill on oxycodone and Robaxin. [facility]

## 2018-04-23 ENCOUNTER — Other Ambulatory Visit (INDEPENDENT_AMBULATORY_CARE_PROVIDER_SITE_OTHER): Payer: Self-pay

## 2018-04-23 MED ORDER — METHOCARBAMOL 500 MG PO TABS: 500.0000 mg | ORAL_TABLET | Freq: Four times a day (QID) | ORAL | 0 refills | Status: AC | PRN

## 2018-04-23 NOTE — Telephone Encounter (Signed)
Patient's wife called stating that the pharmacy did not get the refill request for the Robaxin.  Thank you.

## 2018-04-23 NOTE — Telephone Encounter (Signed)
SENT INTO PHARM AGAIN

## 2018-05-12 ENCOUNTER — Ambulatory Visit (INDEPENDENT_AMBULATORY_CARE_PROVIDER_SITE_OTHER): Payer: BLUE CROSS/BLUE SHIELD | Admitting: Orthopaedic Surgery

## 2018-05-13 ENCOUNTER — Ambulatory Visit (INDEPENDENT_AMBULATORY_CARE_PROVIDER_SITE_OTHER): Payer: BLUE CROSS/BLUE SHIELD

## 2018-05-13 ENCOUNTER — Other Ambulatory Visit: Payer: Self-pay

## 2018-05-13 ENCOUNTER — Encounter (INDEPENDENT_AMBULATORY_CARE_PROVIDER_SITE_OTHER): Payer: Self-pay | Admitting: Orthopaedic Surgery

## 2018-05-13 ENCOUNTER — Ambulatory Visit (INDEPENDENT_AMBULATORY_CARE_PROVIDER_SITE_OTHER): Payer: BLUE CROSS/BLUE SHIELD | Admitting: Orthopaedic Surgery

## 2018-05-13 DIAGNOSIS — S72321D Displaced transverse fracture of shaft of right femur, subsequent encounter for closed fracture with routine healing: Secondary | ICD-10-CM | POA: Diagnosis not present
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
## 2018-07-01 ENCOUNTER — Other Ambulatory Visit: Payer: Self-pay

## 2018-07-01 ENCOUNTER — Ambulatory Visit (INDEPENDENT_AMBULATORY_CARE_PROVIDER_SITE_OTHER): Payer: BC Managed Care – PPO

## 2018-07-01 ENCOUNTER — Ambulatory Visit (INDEPENDENT_AMBULATORY_CARE_PROVIDER_SITE_OTHER): Payer: BC Managed Care – PPO | Admitting: Orthopaedic Surgery

## 2018-07-01 ENCOUNTER — Encounter: Payer: Self-pay | Admitting: Orthopaedic Surgery

## 2018-07-01 DIAGNOSIS — S72321D Displaced transverse fracture of shaft of right femur, subsequent encounter for closed fracture with routine healing: Secondary | ICD-10-CM

## 2018-07-01 NOTE — Progress Notes (Signed)
   Post-Op Visit Note   Patient: Darryl Nguyen           Date of Birth: 1995/08/25           MRN: [id number] Visit Date: 07/01/2018 PCP: Patient, No Pcp Per   Assessment & Plan:  Chief Complaint:  Chief Complaint  Patient presents with  . Right Leg - Routine Post Op   Visit Diagnoses:  1. Closed displaced transverse fracture of shaft of right femur with routine healing, subsequent encounter     Plan: Patient is a pleasant 23 year old gentleman who presents our clinic today nearly 12 weeks status post right femur retrograde intramedullary nail, date of surgery 04/04/2018.  He has been doing well.  He has recently finished home health physical therapy.  He has no pain, but just stiffness primarily in the morning.  He does note that he still has a limp and at times feels off balance.  He is walking without assistance.  Overall, doing very well.  Examination of his right knee shows range of motion from about 2 to 120 degrees.  No swelling.  Full hip range of motion. He is neurovascularly intact distally.  X-rays demonstrate abundant callus formation.  At this point, would like to start the patient in formal physical therapy to work on range of motion, strengthening and gait training.  A prescription was provided to him with this today.  Will allow him to return to work this coming Monday.  He will avoid any running, jumping or other ballistic activities for the next 3 months.  Follow-up with [place] in 3 months time for repeat evaluation and x-rays.  Call with concerns or questions in the meantime.  Follow-Up Instructions: Return in about 3 months (around 10/01/2018).   Orders:  Orders Placed This Encounter  Procedures  . XR FEMUR, MIN 2 VIEWS RIGHT   No orders of the defined types were placed in this encounter.   Imaging: Xr Femur, Min 2 Views Right  Result Date: 07/01/2018 X-rays demonstrate abundant callus formation.  No complications noted.   PMFS History: Patient Active Problem List   Diagnosis Date Noted  . Closed displaced transverse fracture of shaft of right femur ([facility]) 04/04/2018   History reviewed. No pertinent past medical history.  History reviewed. No pertinent family history.  Past Surgical History:  Procedure Laterality Date  . FEMUR IM NAIL Right 04/04/2018   Procedure: INTRAMEDULLARY (IM) RETROGRADE FEMORAL NAILING;  Surgeon: Tarry Kos, MD;  Location: [facility] OR;  Service: Orthopedics;  Laterality: Right;   Social History   Occupational History  . Not on file  Tobacco Use  . Smoking status: Never Smoker  . Smokeless tobacco: Never Used  Substance and Sexual Activity  . Alcohol use: Never    Frequency: Never  . Drug use: Never  . Sexual activity: Not on file

## 2018-07-07 ENCOUNTER — Telehealth: Payer: Self-pay | Admitting: Orthopaedic Surgery

## 2018-07-07 NOTE — Telephone Encounter (Signed)
Faxed 07/01/2018 ov note & work note to [place] [phone]/claim# [id number]

## 2018-10-01 ENCOUNTER — Ambulatory Visit: Payer: BC Managed Care – PPO | Admitting: Orthopaedic Surgery
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
